# Patient Record
Sex: Male | Born: 1975 | Race: Black or African American | Hispanic: No | Marital: Married | Smoking: Never smoker
Health system: Southern US, Community
[De-identification: ages and names within clinical notes are randomized; demographics above are authoritative.]

## PROBLEM LIST (undated history)

## (undated) DIAGNOSIS — E119 Type 2 diabetes mellitus without complications: Secondary | ICD-10-CM

## (undated) HISTORY — DX: Type 2 diabetes mellitus without complications: E11.9

---

## 2003-02-24 ENCOUNTER — Ambulatory Visit (HOSPITAL_COMMUNITY): Admission: RE | Admit: 2003-02-24 | Discharge: 2003-02-24 | Payer: Self-pay | Admitting: Gastroenterology

## 2005-09-24 ENCOUNTER — Emergency Department (HOSPITAL_COMMUNITY): Admission: EM | Admit: 2005-09-24 | Discharge: 2005-09-24 | Payer: Self-pay | Admitting: Emergency Medicine

## 2006-05-02 ENCOUNTER — Ambulatory Visit (HOSPITAL_COMMUNITY): Payer: Self-pay | Admitting: Licensed Clinical Social Worker

## 2006-05-13 ENCOUNTER — Ambulatory Visit (HOSPITAL_COMMUNITY): Payer: Self-pay | Admitting: Licensed Clinical Social Worker

## 2006-06-19 ENCOUNTER — Ambulatory Visit (HOSPITAL_COMMUNITY): Payer: Self-pay | Admitting: Licensed Clinical Social Worker

## 2006-07-10 ENCOUNTER — Ambulatory Visit (HOSPITAL_COMMUNITY): Payer: Self-pay | Admitting: Licensed Clinical Social Worker

## 2006-07-24 ENCOUNTER — Emergency Department (HOSPITAL_COMMUNITY): Admission: EM | Admit: 2006-07-24 | Discharge: 2006-07-24 | Payer: Self-pay | Admitting: Emergency Medicine

## 2006-07-24 ENCOUNTER — Ambulatory Visit (HOSPITAL_COMMUNITY): Payer: Self-pay | Admitting: Licensed Clinical Social Worker

## 2006-08-12 ENCOUNTER — Ambulatory Visit (HOSPITAL_COMMUNITY): Payer: Self-pay | Admitting: Licensed Clinical Social Worker

## 2006-08-28 ENCOUNTER — Ambulatory Visit (HOSPITAL_COMMUNITY): Payer: Self-pay | Admitting: Licensed Clinical Social Worker

## 2006-09-11 ENCOUNTER — Ambulatory Visit (HOSPITAL_COMMUNITY): Payer: Self-pay | Admitting: Licensed Clinical Social Worker

## 2006-10-15 ENCOUNTER — Ambulatory Visit (HOSPITAL_COMMUNITY): Payer: Self-pay | Admitting: Licensed Clinical Social Worker

## 2006-11-26 ENCOUNTER — Ambulatory Visit (HOSPITAL_COMMUNITY): Payer: Self-pay | Admitting: Licensed Clinical Social Worker

## 2007-02-11 ENCOUNTER — Ambulatory Visit (HOSPITAL_COMMUNITY): Payer: Self-pay | Admitting: Licensed Clinical Social Worker

## 2007-04-01 ENCOUNTER — Ambulatory Visit (HOSPITAL_COMMUNITY): Payer: Self-pay | Admitting: Licensed Clinical Social Worker

## 2007-05-12 ENCOUNTER — Ambulatory Visit (HOSPITAL_COMMUNITY): Payer: Self-pay | Admitting: Licensed Clinical Social Worker

## 2009-02-22 IMAGING — CR DG CERVICAL SPINE COMPLETE 4+V
2 series · 4 of 4 positions shown · non-contrast
Comparison: NONE

CLINICAL DATA: Pain. 

CERVICAL SPINE ??? AP AND LATERAL VIEWS

[Series 1: view not recorded · 0.17mm/px · 2 of 2 slices shown (1 of 2)]
[im 1/2]
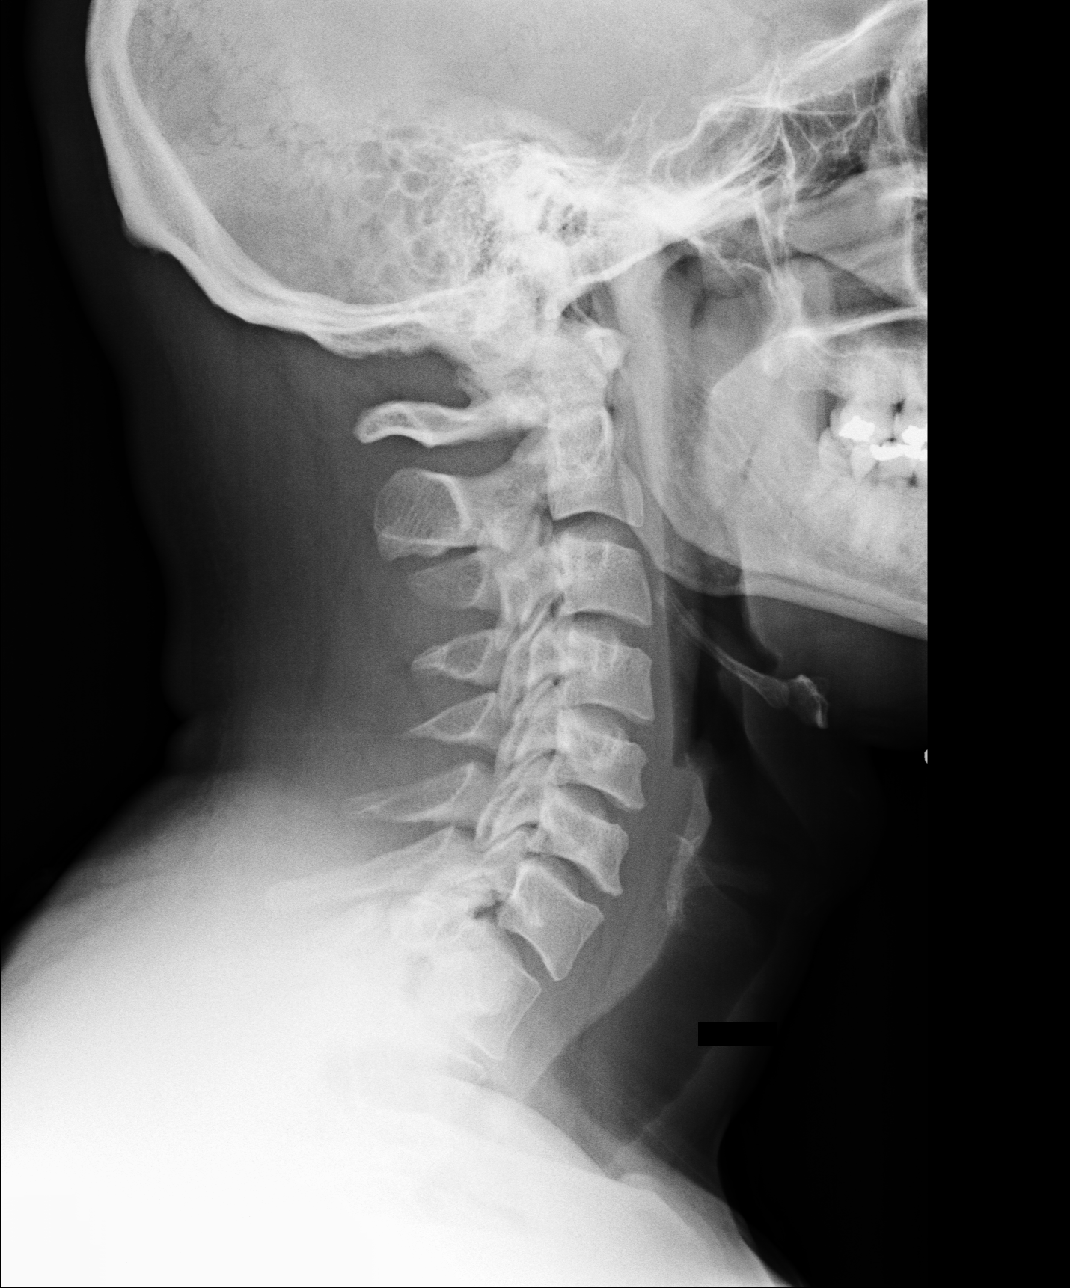
[im 2/2]
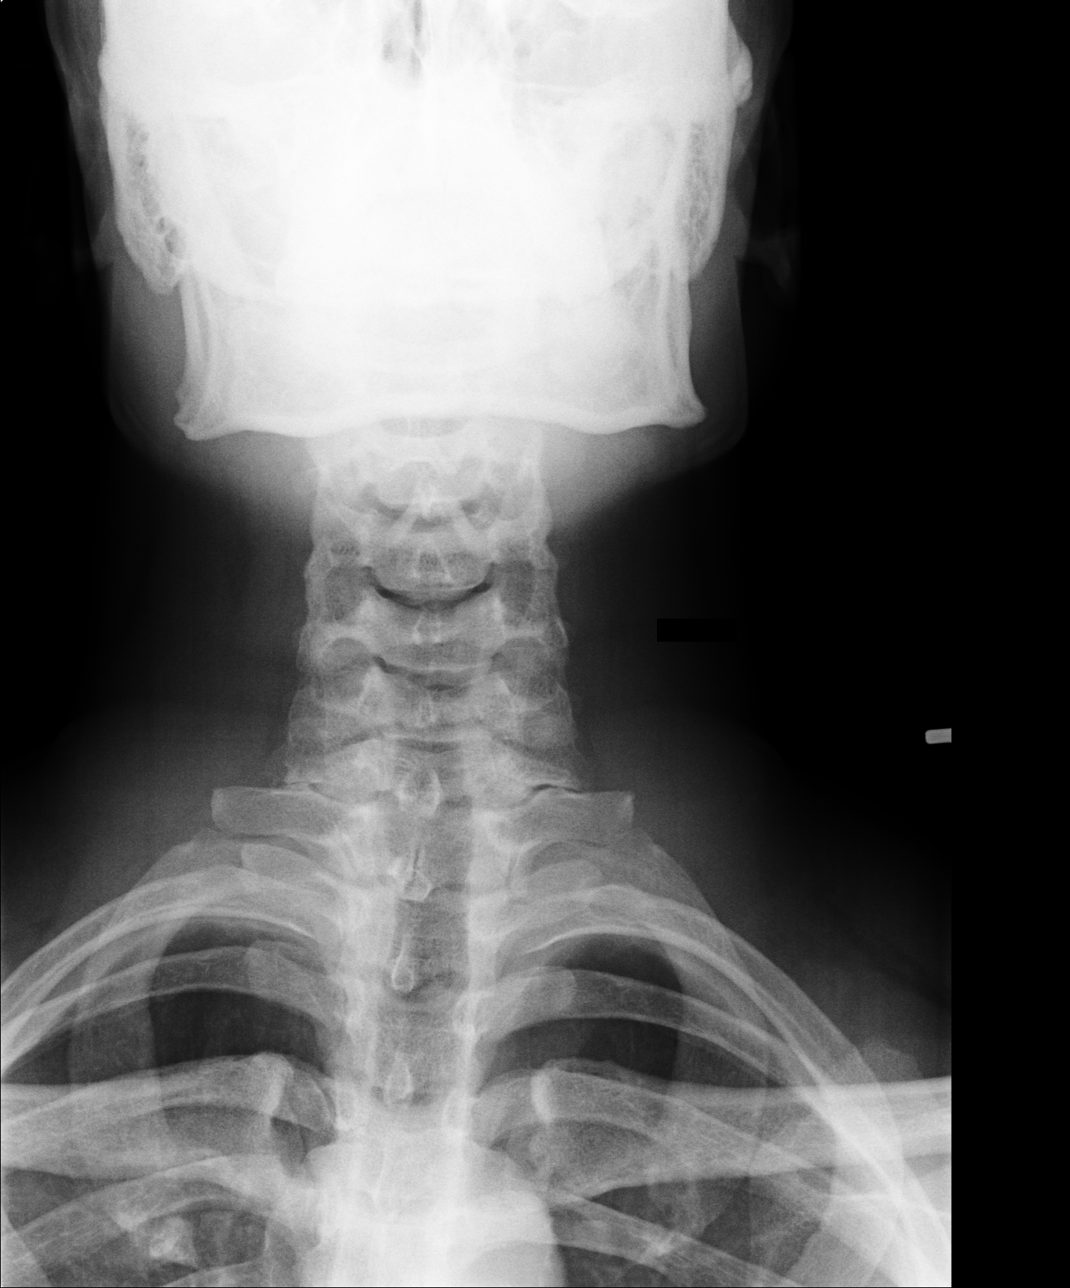

[Series 2: view not recorded · 0.17mm/px · 2 of 2 slices shown (2 of 2)]
[im 1/2]
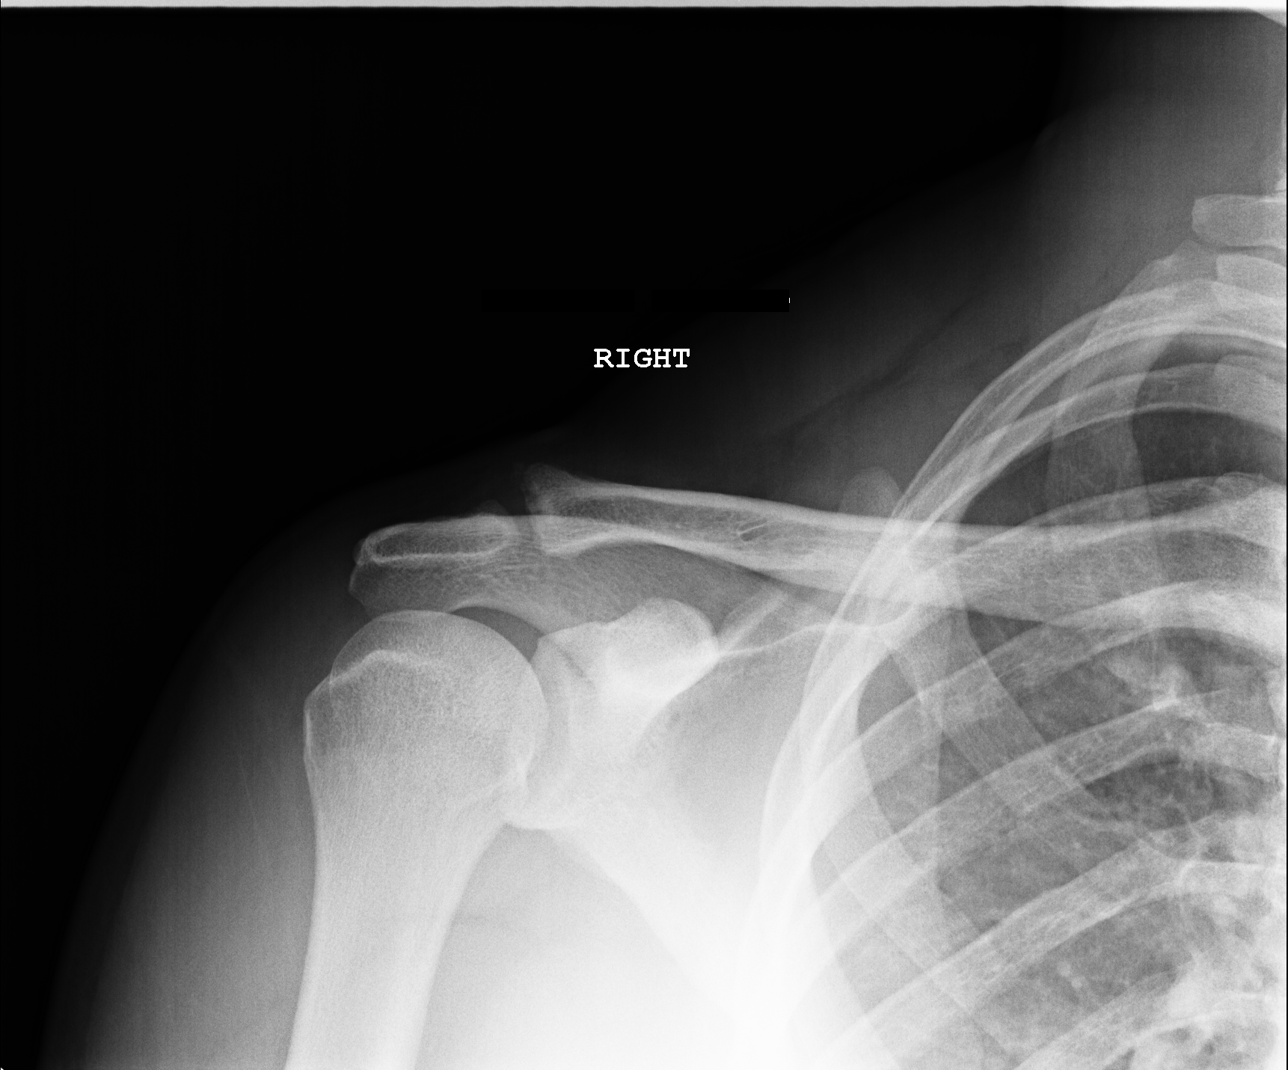
[im 2/2]
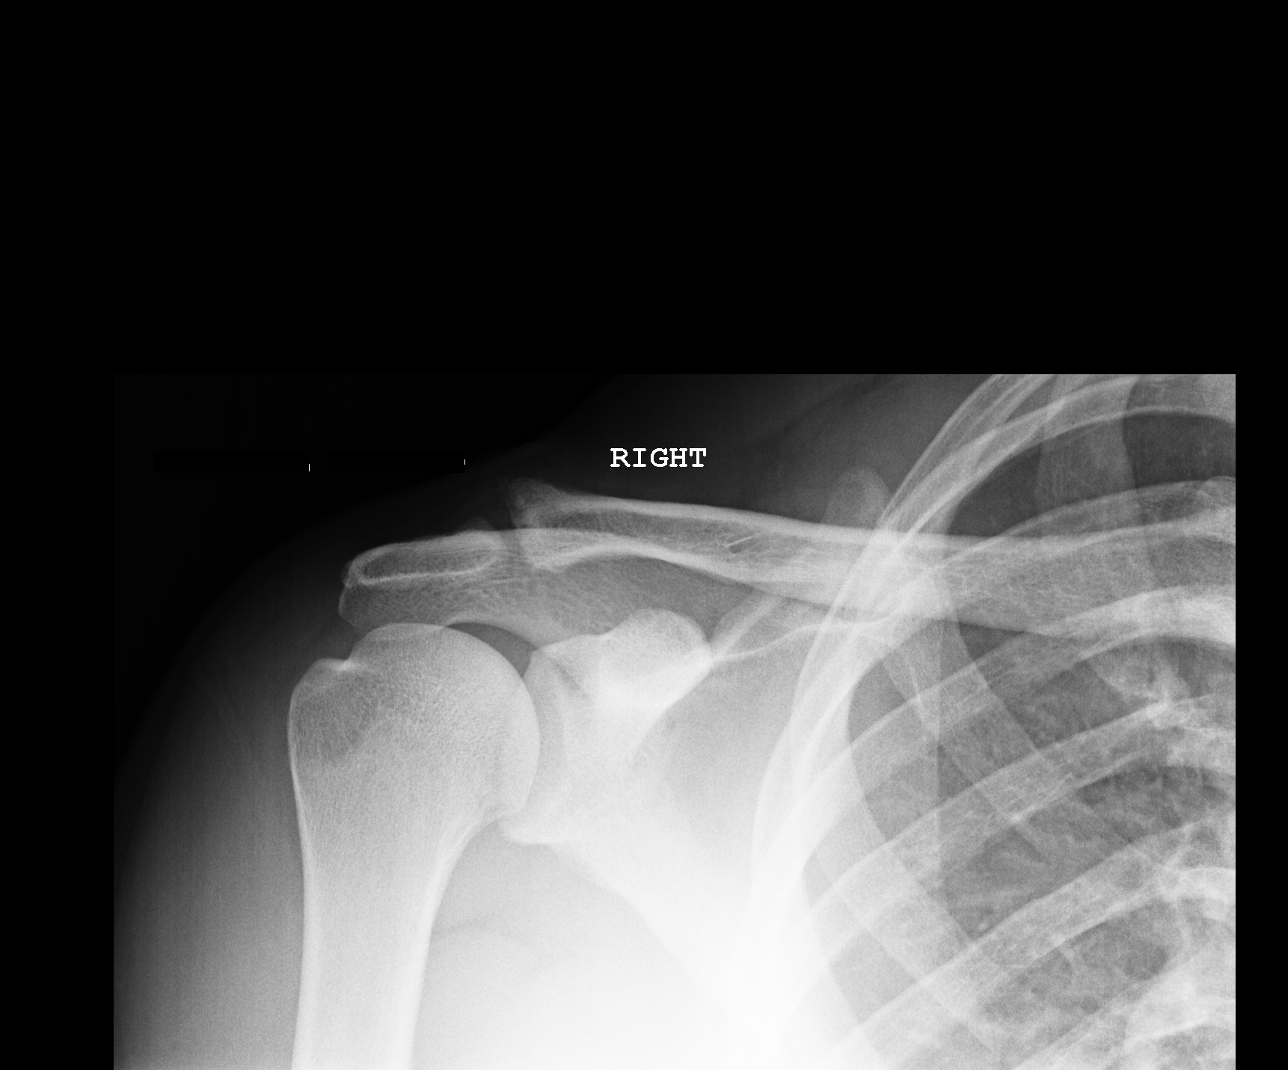

[4 of 4 positions shown; findings below may reference images not displayed]

FINDINGS: Minimal spurring is present at C6-C7 anteriorly. 
Vertebral height and intervertebral disc spaces are otherwise 
unremarkable. No fracture, dislocation, or prevertebral soft 
tissue swelling. Posterior elements are intact. 

electronically reviewed on 07/10/2007 Dict Date: 07/10/2007  Tran 
Date:  07/10/2007 DAS  [REDACTED]

## 2010-06-02 NOTE — Op Note (Signed)
NAMESHERLEY, LESER                        ACCOUNT NO.:  [id number]   MEDICAL RECORD NO.:  [id number]                   PATIENT TYPE:  AMB   LOCATION:  ENDO                                 FACILITY:  [facility]   PHYSICIAN:  Anselmo Rod, M.D.               DATE OF BIRTH:  04-04-1975   DATE OF PROCEDURE:  02/24/2003  DATE OF DISCHARGE:                                 OPERATIVE REPORT   PROCEDURE PERFORMED:  Colonoscopy.   ENDOSCOPIST:  Anselmo Rod, M.D.   INSTRUMENT USED:  Olympus video colonoscope.   INDICATIONS FOR PROCEDURE:  A 35 year old African-American male with a  history of rectal bleeding for the last one year.  There is a questionable  family history of colon cancer in the paternal grandfather, rule out colonic  polyps, masses, etc.   PREPROCEDURE PREPARATION:  Informed consent was procured from the patient.  The patient fasted for eight hours prior to the procedure and took the  bottle of magnesium citrate and a gallon of GoLYTELY the night prior to the  procedure.   PREPROCEDURE PHYSICAL EXAMINATION:  VITAL SIGNS:  Stable.  NECK:  Supple.  CHEST:  Clear to auscultation.  CARDIAC:  Regular rhythm.  ABDOMEN:  Soft with normal bowel sounds.   DESCRIPTION OF PROCEDURE:  The patient was placed in the left lateral  decubitus position, sedated with 60 mg of Demerol and 6 mg of Versed  intravenously.  Once the patient was adequately sedated, maintained on low-  flow oxygen and continuous cardiac monitoring, the Olympus video colonoscope  was advanced from the rectum to the cecum.  There was some residual stool  which required multiple washings.  The appendiceal orifice and ileocecal  valve were clearly visualized with the poor prep.  No masses, polyps,  erosions, ulcerations or diverticula were seen.  The patient tolerated the  procedure well without complications.   IMPRESSION:  Essentially normal colonoscopy, except for small internal  hemorrhoids.   RECOMMENDATIONS:  1. Recommend the patient continue a high fiber diet with liberal fluid     intake.  2. Repeat colorectal screening at the age of 67, unless the patient develops     any abnormal symptoms in the interim, concerning the fact that his     grandfather had a questionable history of colon cancer.  3. Have the patient follow up within the next two weeks for further     recommendations.                                               Anselmo Rod, M.D.    JNM/MEDQ  D:  02/24/2003  T:  02/24/2003  Job:  [id number]   cc:   Gabriel Earing, M.D.  [address]  [place]  [place] [address]  Fax: [phone]

## 2016-01-16 DIAGNOSIS — C801 Malignant (primary) neoplasm, unspecified: Secondary | ICD-10-CM

## 2016-01-16 HISTORY — PX: BREAST SURGERY: SHX581

## 2016-01-16 HISTORY — DX: Malignant (primary) neoplasm, unspecified: C80.1

## 2018-12-02 ENCOUNTER — Telehealth: Payer: Self-pay | Admitting: Emergency Medicine

## 2018-12-02 DIAGNOSIS — B349 Viral infection, unspecified: Secondary | ICD-10-CM

## 2018-12-02 DIAGNOSIS — R509 Fever, unspecified: Secondary | ICD-10-CM

## 2018-12-02 NOTE — Progress Notes (Signed)
E-Visit for Corona Virus Screening   Your current symptoms could be consistent with the coronavirus.  Many health care providers can now test patients at their office but not all are.   has multiple testing sites. For information on our COVID testing locations and hours go to [email or web address]     I recommend that you be tested for COVID-19.  I cannot order this test in an e-visit, however, information is available at the link above regarding our testing centers.  Many of our centers are drive-up and no appointment needed.  It is also important that you closely monitor your symptoms. If you have worsening/persistent fever, cough, or shortness of breath, you will need to make an in-person visit at one of our urgent cares or ERs.  Additionally, if you have significant leg swelling, I would recommend that you seek an in-person visit.  If it is minor, you can try and elevate your legs while resting and use compression stockings.  If you are a dialysis patient, or have congestive heart failure, I recommend that you have an in-person visit.  Otherwise,  Please quarantine yourself while awaiting your test results.  We are enrolling you in our [place] [place] for [place] . Daily you will receive a questionnaire within the [facility] website. Our COVID 19 response team willl be monitoriing your responses daily. Please continue good preventive care measures, including:  frequent hand-washing, avoid touching your face, cover coughs/sneezes, stay out of crowds and keep a 6 foot distance from others.    COVID-19 is a respiratory illness with symptoms that are similar to the flu. Symptoms are typically mild to moderate, but there have been cases of severe illness and death due to the virus. The following symptoms may appear 2-14 days after exposure: . Fever . Cough . Shortness of breath or difficulty breathing . Chills . Repeated shaking with chills . Muscle  pain . Headache . Sore throat . New loss of taste or smell . Fatigue . Congestion or runny nose . Nausea or vomiting . Diarrhea  If you develop fever/cough/breathlessness, please stay home for 10 days with improving symptoms and until you have had 24 hours of no fever (without taking a fever reducer).  Go to the nearest hospital ED for assessment if fever/cough/breathlessness are severe or illness seems like a threat to life.  It is vitally important that if you feel that you have an infection such as this virus or any other virus that you stay home and away from places where you may spread it to others.  You should avoid contact with people age 24 and older.   You should wear a mask or cloth face covering over your nose and mouth if you must be around other people or animals, including pets (even at home). Try to stay at least 6 feet away from other people. This will protect the people around you.  You may also take acetaminophen (Tylenol) as needed for fever.   Reduce your risk of any infection by using the same precautions used for avoiding the common cold or flu:  Marland Kitchen Wash your hands often with soap and warm water for at least 20 seconds.  If soap and water are not readily available, use an alcohol-based hand sanitizer with at least 60% alcohol.  . If coughing or sneezing, cover your mouth and nose by coughing or sneezing into the elbow areas of your shirt or coat, into a tissue or into your sleeve (not your hands). Marland Kitchen  Avoid shaking hands with others and consider head nods or verbal greetings only. . Avoid touching your eyes, nose, or mouth with unwashed hands.  . Avoid close contact with people who are sick. . Avoid places or events with large numbers of people in one location, like concerts or sporting events. . Carefully consider travel plans you have or are making. . If you are planning any travel outside or inside the [place], visit the CDC's Travelers' Health webpage for the latest health  notices. . If you have some symptoms but not all symptoms, continue to monitor at home and seek medical attention if your symptoms worsen. . If you are having a medical emergency, call 911.  HOME CARE . Only take medications as instructed by your medical team. . Drink plenty of fluids and get plenty of rest. . A steam or ultrasonic humidifier can help if you have congestion.   GET HELP RIGHT AWAY IF YOU HAVE EMERGENCY WARNING SIGNS** FOR COVID-19. If you or someone is showing any of these signs seek emergency medical care immediately. Call 911 or proceed to your closest emergency facility if: . You develop worsening high fever. . Trouble breathing . Bluish lips or face . Persistent pain or pressure in the chest . New confusion . Inability to wake or stay awake . You cough up blood. . Your symptoms become more severe  **This list is not all possible symptoms. Contact your medical provider for any symptoms that are sever or concerning to you.   MAKE SURE YOU   Understand these instructions.  Will watch your condition.  Will get help right away if you are not doing well or get worse.  Your e-visit answers were reviewed by a board certified advanced clinical practitioner to complete your personal care plan.  Depending on the condition, your plan could have included both over the counter or prescription medications.  If there is a problem please reply once you have received a response from your provider.  Your safety is important to [place].  If you have drug allergies check your prescription carefully.    You can use MyChart to ask questions about today's visit, request a non-urgent call back, or ask for a work or school excuse for 24 hours related to this e-Visit. If it has been greater than 24 hours you will need to follow up with your provider, or enter a new e-Visit to address those concerns. You will get an e-mail in the next two days asking about your experience.  I hope that your  e-visit has been valuable and will speed your recovery. Thank you for using e-visits.   Greater than 5 minutes, yet less than 10 minutes was used in reviewing the patient's chart, questionnaire, prescribing medications, and documentation for this visit.

## 2022-09-12 ENCOUNTER — Ambulatory Visit
Admission: EM | Admit: 2022-09-12 | Discharge: 2022-09-12 | Disposition: A | Discharge status: Home / Self Care | Payer: Medicaid Other | Attending: Internal Medicine | Admitting: Internal Medicine

## 2022-09-12 DIAGNOSIS — H60501 Unspecified acute noninfective otitis externa, right ear: Secondary | ICD-10-CM

## 2022-09-12 MED ORDER — NEOMYCIN-POLYMYXIN-HC 3.5-10000-1 OT SUSP: 4.0000 [drp] | Freq: Three times a day (TID) | OTIC | 0 refills | Status: AC

## 2022-09-12 NOTE — Discharge Instructions (Signed)
Start Cortisporin antibiotic eardrops to the right ear as prescribed.  Keep water out of the ear until you are done with treatment.  You may do Tylenol or ibuprofen as needed for pain.  You may also do warm compresses to the outside of the ear as needed.  Please follow-up with your PCP in 2 days for recheck.  Please go to the emergency room if you develop any worsening symptoms.  I hope you feel better soon!

## 2022-09-12 NOTE — ED Provider Notes (Signed)
[facility]    CSN: [id number] Arrival date & time: 09/12/22  0955      History   Chief Complaint Chief Complaint  Patient presents with   Otalgia    HPI Alexis Bush is a 47 y.o. male presents for evaluation of ear pain.  Patient reports 3 days of right ear pain/pressure/swelling with diminished hearing.  Denies any drainage, URI symptoms.  No recent swimming or excessive water in the ear.  He did use a Q-tip prior to onset.  No OTC medications have been used since onset.  He did use some type of oil in the ear without improvement.  No other concerns at this time.   Otalgia   Past Medical History:  Diagnosis Date   breast cancer 2018   Diabetes mellitus without complication (HCC)     There are no problems to display for this patient.   Past Surgical History:  Procedure Laterality Date   BREAST SURGERY Right 2018   removal of breast tissue       Home Medications    Prior to Admission medications   Medication Sig Start Date End Date Taking? Authorizing Provider  neomycin-polymyxin-hydrocortisone (CORTISPORIN) 3.5-10000-1 OTIC suspension Place 4 drops into the right ear 3 (three) times daily for 7 days. 09/12/22 09/19/22 Yes Radford Pax, NP    Family History History reviewed. No pertinent family history.  Social History Social History   Tobacco Use   Smoking status: Never   Smokeless tobacco: Never  Substance Use Topics   Alcohol use: Not Currently   Drug use: Yes    Types: Marijuana    Comment: every day use     Allergies   Oxycodone-acetaminophen   Review of Systems Review of Systems  HENT:  Positive for ear pain.      Physical Exam Triage Vital Signs ED Triage Vitals  Encounter Vitals Group     BP 09/12/22 1033 (!) 154/87     Systolic BP Percentile --      Diastolic BP Percentile --      Pulse Rate 09/12/22 1033 62     Resp 09/12/22 1033 16     Temp 09/12/22 1033 98.1 F (36.7 C)     Temp Source 09/12/22 1033 Oral      SpO2 09/12/22 1033 96 %     Weight --      Height --      Head Circumference --      Peak Flow --      Pain Score 09/12/22 1029 7     Pain Loc --      Pain Education --      Exclude from Growth Chart --    No data found.  Updated Vital Signs BP (!) 154/87 (BP Location: Left Arm)   Pulse 62   Temp 98.1 F (36.7 C) (Oral)   Resp 16   SpO2 96%   Visual Acuity Right Eye Distance:   Left Eye Distance:   Bilateral Distance:    Right Eye Near:   Left Eye Near:    Bilateral Near:     Physical Exam Vitals and nursing note reviewed.  Constitutional:      General: He is not in acute distress.    Appearance: Normal appearance. He is not ill-appearing.  HENT:     Head: Normocephalic and atraumatic.     Right Ear: Tympanic membrane normal. Swelling present. There is no impacted cerumen. No foreign body. No mastoid tenderness. Tympanic membrane  is not erythematous.     Left Ear: Tympanic membrane and ear canal normal.     Ears:     Comments: Moderate swelling with erythema of the left canal without drainage. Eyes:     Pupils: Pupils are equal, round, and reactive to light.  Cardiovascular:     Rate and Rhythm: Normal rate.  Pulmonary:     Effort: Pulmonary effort is normal.  Skin:    General: Skin is warm and dry.  Neurological:     General: No focal deficit present.     Mental Status: He is alert and oriented to person, place, and time.  Psychiatric:        Mood and Affect: Mood normal.        Behavior: Behavior normal.      UC Treatments / Results  Labs (all labs ordered are listed, but only abnormal results are displayed) Labs Reviewed - No data to display  EKG   Radiology No results found.  Procedures Procedures (including critical care time)  Medications Ordered in UC Medications - No data to display  Initial Impression / Assessment and Plan / UC Course  I have reviewed the triage vital signs and the nursing notes.  Pertinent labs & imaging results  that were available during my care of the patient were reviewed by me and considered in my medical decision making (see chart for details).     Reviewed exam and symptoms with patient.  No red flags.  Will start Cortisporin antibiotic drops to the right ear as prescribed.  Patient structured to keep water out of the ear until treatment is complete and he verbalized understanding.  PCP follow-up 2 days for recheck.  ER precautions reviewed and patient verbalized understanding. Final Clinical Impressions(s) / UC Diagnoses   Final diagnoses:  Acute otitis externa of right ear, unspecified type     Discharge Instructions      Start Cortisporin antibiotic eardrops to the right ear as prescribed.  Keep water out of the ear until you are done with treatment.  You may do Tylenol or ibuprofen as needed for pain.  You may also do warm compresses to the outside of the ear as needed.  Please follow-up with your PCP in 2 days for recheck.  Please go to the emergency room if you develop any worsening symptoms.  I hope you feel better soon!    ED Prescriptions     Medication Sig Dispense Auth. Provider   neomycin-polymyxin-hydrocortisone (CORTISPORIN) 3.5-10000-1 OTIC suspension Place 4 drops into the right ear 3 (three) times daily for 7 days. 10 mL Radford Pax, NP      PDMP not reviewed this encounter.   Radford Pax, NP 09/12/22 1050

## 2022-09-12 NOTE — ED Triage Notes (Signed)
Pt presents to UC w/ c/o right ear pain, pressure, swelling x3 days. Pt reports decreased hearing. Denies drainage or injury to the ear.
